# Patient Record
Sex: Male | Born: 2000 | Race: White | Hispanic: No | Marital: Single | Smoking: Current every day smoker
Health system: Southern US, Community
[De-identification: ages and names within clinical notes are randomized; demographics above are authoritative.]

## PROBLEM LIST (undated history)

## (undated) ENCOUNTER — Ambulatory Visit: Admission: EM | Disposition: A | Discharge status: Home / Self Care | Payer: Medicaid Other

## (undated) ENCOUNTER — Emergency Department: Payer: No Typology Code available for payment source | Source: Home / Self Care

## (undated) ENCOUNTER — Emergency Department

## (undated) DIAGNOSIS — F909 Attention-deficit hyperactivity disorder, unspecified type: Secondary | ICD-10-CM

## (undated) DIAGNOSIS — K5900 Constipation, unspecified: Secondary | ICD-10-CM

---
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
## 2021-02-02 DIAGNOSIS — G479 Sleep disorder, unspecified: Secondary | ICD-10-CM | POA: Diagnosis not present

## 2021-02-02 DIAGNOSIS — G4489 Other headache syndrome: Secondary | ICD-10-CM | POA: Diagnosis not present

## 2021-02-02 DIAGNOSIS — G44309 Post-traumatic headache, unspecified, not intractable: Secondary | ICD-10-CM | POA: Diagnosis not present

## 2021-02-02 DIAGNOSIS — R4586 Emotional lability: Secondary | ICD-10-CM | POA: Diagnosis not present

## 2021-02-08 DIAGNOSIS — K5900 Constipation, unspecified: Secondary | ICD-10-CM | POA: Diagnosis not present

## 2021-03-04 DIAGNOSIS — F913 Oppositional defiant disorder: Secondary | ICD-10-CM | POA: Diagnosis not present

## 2021-03-04 DIAGNOSIS — F902 Attention-deficit hyperactivity disorder, combined type: Secondary | ICD-10-CM | POA: Diagnosis not present

## 2021-03-19 DIAGNOSIS — F913 Oppositional defiant disorder: Secondary | ICD-10-CM | POA: Diagnosis not present

## 2021-03-19 DIAGNOSIS — F902 Attention-deficit hyperactivity disorder, combined type: Secondary | ICD-10-CM | POA: Diagnosis not present

## 2021-05-13 DIAGNOSIS — S92334A Nondisplaced fracture of third metatarsal bone, right foot, initial encounter for closed fracture: Secondary | ICD-10-CM | POA: Diagnosis not present

## 2021-05-13 DIAGNOSIS — S92514A Nondisplaced fracture of proximal phalanx of right lesser toe(s), initial encounter for closed fracture: Secondary | ICD-10-CM | POA: Diagnosis not present

## 2021-05-13 DIAGNOSIS — G8911 Acute pain due to trauma: Secondary | ICD-10-CM | POA: Diagnosis not present

## 2021-05-13 DIAGNOSIS — F489 Nonpsychotic mental disorder, unspecified: Secondary | ICD-10-CM | POA: Diagnosis not present

## 2021-05-28 DIAGNOSIS — F0781 Postconcussional syndrome: Secondary | ICD-10-CM | POA: Diagnosis not present

## 2021-05-28 DIAGNOSIS — I10 Essential (primary) hypertension: Secondary | ICD-10-CM | POA: Diagnosis not present

## 2021-05-28 DIAGNOSIS — R110 Nausea: Secondary | ICD-10-CM | POA: Diagnosis not present

## 2021-05-28 DIAGNOSIS — G4489 Other headache syndrome: Secondary | ICD-10-CM | POA: Diagnosis not present

## 2021-06-04 DIAGNOSIS — H5203 Hypermetropia, bilateral: Secondary | ICD-10-CM | POA: Diagnosis not present

## 2021-06-09 DIAGNOSIS — H5213 Myopia, bilateral: Secondary | ICD-10-CM | POA: Diagnosis not present

## 2021-06-29 DIAGNOSIS — G4489 Other headache syndrome: Secondary | ICD-10-CM | POA: Diagnosis not present

## 2021-06-29 DIAGNOSIS — H538 Other visual disturbances: Secondary | ICD-10-CM | POA: Diagnosis not present

## 2021-06-29 DIAGNOSIS — G478 Other sleep disorders: Secondary | ICD-10-CM | POA: Diagnosis not present

## 2021-06-29 DIAGNOSIS — R4586 Emotional lability: Secondary | ICD-10-CM | POA: Diagnosis not present

## 2021-07-01 DIAGNOSIS — J069 Acute upper respiratory infection, unspecified: Secondary | ICD-10-CM | POA: Diagnosis not present

## 2021-07-01 DIAGNOSIS — J0100 Acute maxillary sinusitis, unspecified: Secondary | ICD-10-CM | POA: Diagnosis not present

## 2021-07-01 DIAGNOSIS — Z03818 Encounter for observation for suspected exposure to other biological agents ruled out: Secondary | ICD-10-CM | POA: Diagnosis not present

## 2021-07-01 DIAGNOSIS — R110 Nausea: Secondary | ICD-10-CM | POA: Diagnosis not present

## 2021-07-08 DIAGNOSIS — Z20822 Contact with and (suspected) exposure to covid-19: Secondary | ICD-10-CM | POA: Diagnosis not present

## 2021-07-08 DIAGNOSIS — B349 Viral infection, unspecified: Secondary | ICD-10-CM | POA: Diagnosis not present

## 2021-07-08 DIAGNOSIS — R10819 Abdominal tenderness, unspecified site: Secondary | ICD-10-CM | POA: Diagnosis not present

## 2021-07-08 DIAGNOSIS — R509 Fever, unspecified: Secondary | ICD-10-CM | POA: Diagnosis not present

## 2021-07-08 DIAGNOSIS — F419 Anxiety disorder, unspecified: Secondary | ICD-10-CM | POA: Diagnosis not present

## 2021-07-08 DIAGNOSIS — R918 Other nonspecific abnormal finding of lung field: Secondary | ICD-10-CM | POA: Diagnosis not present

## 2021-07-08 DIAGNOSIS — F909 Attention-deficit hyperactivity disorder, unspecified type: Secondary | ICD-10-CM | POA: Diagnosis not present

## 2021-07-08 DIAGNOSIS — R350 Frequency of micturition: Secondary | ICD-10-CM | POA: Diagnosis not present

## 2021-07-08 DIAGNOSIS — R112 Nausea with vomiting, unspecified: Secondary | ICD-10-CM | POA: Diagnosis not present

## 2021-07-08 DIAGNOSIS — R5381 Other malaise: Secondary | ICD-10-CM | POA: Diagnosis not present

## 2021-07-14 DIAGNOSIS — H52223 Regular astigmatism, bilateral: Secondary | ICD-10-CM | POA: Diagnosis not present

## 2021-08-03 DIAGNOSIS — Z419 Encounter for procedure for purposes other than remedying health state, unspecified: Secondary | ICD-10-CM | POA: Diagnosis not present

## 2021-09-02 DIAGNOSIS — Z419 Encounter for procedure for purposes other than remedying health state, unspecified: Secondary | ICD-10-CM | POA: Diagnosis not present

## 2021-10-03 DIAGNOSIS — Z419 Encounter for procedure for purposes other than remedying health state, unspecified: Secondary | ICD-10-CM | POA: Diagnosis not present

## 2021-11-03 DIAGNOSIS — Z419 Encounter for procedure for purposes other than remedying health state, unspecified: Secondary | ICD-10-CM | POA: Diagnosis not present

## 2021-12-01 DIAGNOSIS — Z419 Encounter for procedure for purposes other than remedying health state, unspecified: Secondary | ICD-10-CM | POA: Diagnosis not present

## 2021-12-21 DIAGNOSIS — M5416 Radiculopathy, lumbar region: Secondary | ICD-10-CM | POA: Diagnosis not present

## 2021-12-28 DIAGNOSIS — M4807 Spinal stenosis, lumbosacral region: Secondary | ICD-10-CM | POA: Diagnosis not present

## 2021-12-28 DIAGNOSIS — M5126 Other intervertebral disc displacement, lumbar region: Secondary | ICD-10-CM | POA: Diagnosis not present

## 2021-12-28 DIAGNOSIS — M5127 Other intervertebral disc displacement, lumbosacral region: Secondary | ICD-10-CM | POA: Diagnosis not present

## 2021-12-28 DIAGNOSIS — M48061 Spinal stenosis, lumbar region without neurogenic claudication: Secondary | ICD-10-CM | POA: Diagnosis not present

## 2022-01-01 DIAGNOSIS — Z419 Encounter for procedure for purposes other than remedying health state, unspecified: Secondary | ICD-10-CM | POA: Diagnosis not present

## 2022-01-24 DIAGNOSIS — G250 Essential tremor: Secondary | ICD-10-CM | POA: Diagnosis not present

## 2022-01-28 DIAGNOSIS — M5416 Radiculopathy, lumbar region: Secondary | ICD-10-CM | POA: Diagnosis not present
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
## 2022-01-31 DIAGNOSIS — Z419 Encounter for procedure for purposes other than remedying health state, unspecified: Secondary | ICD-10-CM | POA: Diagnosis not present

## 2022-03-03 DIAGNOSIS — Z419 Encounter for procedure for purposes other than remedying health state, unspecified: Secondary | ICD-10-CM | POA: Diagnosis not present

## 2022-04-02 DIAGNOSIS — Z419 Encounter for procedure for purposes other than remedying health state, unspecified: Secondary | ICD-10-CM | POA: Diagnosis not present

## 2022-05-03 DIAGNOSIS — Z419 Encounter for procedure for purposes other than remedying health state, unspecified: Secondary | ICD-10-CM | POA: Diagnosis not present

## 2022-05-06 ENCOUNTER — Emergency Department: Payer: Medicaid Other

## 2022-05-06 ENCOUNTER — Other Ambulatory Visit: Payer: Self-pay

## 2022-05-06 ENCOUNTER — Emergency Department
Admission: EM | Admit: 2022-05-06 | Discharge: 2022-05-06 | Disposition: A | Discharge status: Home / Self Care | Payer: Medicaid Other | Attending: Emergency Medicine | Admitting: Emergency Medicine

## 2022-05-06 DIAGNOSIS — S060X9A Concussion with loss of consciousness of unspecified duration, initial encounter: Secondary | ICD-10-CM | POA: Insufficient documentation

## 2022-05-06 DIAGNOSIS — Y92810 Car as the place of occurrence of the external cause: Secondary | ICD-10-CM | POA: Insufficient documentation

## 2022-05-06 DIAGNOSIS — Y9389 Activity, other specified: Secondary | ICD-10-CM | POA: Diagnosis not present

## 2022-05-06 DIAGNOSIS — S069X9A Unspecified intracranial injury with loss of consciousness of unspecified duration, initial encounter: Secondary | ICD-10-CM | POA: Diagnosis not present

## 2022-05-06 DIAGNOSIS — S060X1A Concussion with loss of consciousness of 30 minutes or less, initial encounter: Secondary | ICD-10-CM | POA: Diagnosis not present

## 2022-05-06 DIAGNOSIS — W208XXA Other cause of strike by thrown, projected or falling object, initial encounter: Secondary | ICD-10-CM | POA: Insufficient documentation

## 2022-05-06 DIAGNOSIS — R9431 Abnormal electrocardiogram [ECG] [EKG]: Secondary | ICD-10-CM | POA: Diagnosis not present

## 2022-05-06 DIAGNOSIS — S0990XA Unspecified injury of head, initial encounter: Secondary | ICD-10-CM | POA: Diagnosis present

## 2022-05-06 DIAGNOSIS — S060X0A Concussion without loss of consciousness, initial encounter: Secondary | ICD-10-CM | POA: Diagnosis not present

## 2022-05-06 NOTE — ED Provider Notes (Signed)
[facility] Provider Note    Event Date/Time   First MD Initiated Contact with Patient 05/06/22 [phone]     (approximate)   History   Chief Complaint Head Injury and Loss of Consciousness   HPI Donald Arnold is a 21 y.o. male, history of ADHD, presents emergency department for evaluation of head injury.  Patient states he was working on his car yesterday when the hood fell down and hit him on the back of the head, causing him to lose consciousness.  Patient states that he remembers waking up on the ground and vomiting approximately 4-5 times after the event.  He is not nauseous at this time, though does state that he has photophobia and intermittently has episodes where his vision goes black occasionally, though is not having any visual disturbances now.  Denies hearing changes, chest pain, shortness of breath, neck pain, abdominal pain, numb/tingling in upper or lower extremities, or vertigo.  History Limitations: No limitations.        Physical Exam  Triage Vital Signs: ED Triage Vitals  Enc Vitals Group     BP 05/06/22 0938 118/66     Pulse Rate 05/06/22 0938 64     Resp 05/06/22 0938 14     Temp 05/06/22 0938 98.6 F (37 C)     Temp Source 05/06/22 0938 Oral     SpO2 05/06/22 0938 98 %     Weight 05/06/22 0934 186 lb (84.4 kg)     Height 05/06/22 0934 6\' 2"  (1.88 m)     Head Circumference --      Peak Flow --      Pain Score 05/06/22 0933 8     Pain Loc --      Pain Edu? --      Excl. in GC? --     Most recent vital signs: Vitals:   05/06/22 0938  BP: 118/66  Pulse: 64  Resp: 14  Temp: 98.6 F (37 C)  SpO2: 98%    General: Awake, NAD.  Skin: Warm, dry. No rashes or lesions.  Eyes: PERRL. Conjunctivae normal.  EOMI.  Gross visual acuity intact.  Visual fields intact. CV: Good peripheral perfusion.  Resp: Normal effort.  Abd: Soft, non-tender. No distention.  Neuro: At baseline. No gross neurological deficits.  Cranial nerves II  through XII intact.  5/5 strength in upper and lower extremities.  Sensation intact in all extremities.  He is able to ambulate on his own without ataxia  Focused Exam: No lacerations or gross deformities to the head.  No midline cervical spine tenderness.  Normal range of motion of the head/neck.  Physical Exam    ED Results / Procedures / Treatments  Labs (all labs ordered are listed, but only abnormal results are displayed) Labs Reviewed - No data to display   EKG N/A.   RADIOLOGY  ED Provider Interpretation: I personally viewed and interpreted the CT scan, no evidence of acute intracranial abnormalities.  CT Head Wo Contrast  Result Date: 05/06/2022 CLINICAL DATA:  Head trauma, moderate-severe. Patient was working on car yesterday and hood fell down and hit him on the head. Patient did lose consciousness and vomit afterwards. EXAM: CT HEAD WITHOUT CONTRAST TECHNIQUE: Contiguous axial images were obtained from the base of the skull through the vertex without intravenous contrast. RADIATION DOSE REDUCTION: This exam was performed according to the departmental dose-optimization program which includes automated exposure control, adjustment of the mA and/or kV according to patient  size and/or use of iterative reconstruction technique. COMPARISON:  August 08, 2020, July 01, 2020 FINDINGS: Brain: No evidence of acute infarction, hemorrhage, hydrocephalus, extra-axial collection or mass lesion/mass effect. Vascular: No hyperdense vessel or unexpected calcification. Skull: No acute fracture. Sinuses/Orbits: Partial opacification of the left maxillary sinus. Unchanged probable mucous retention cyst within the right sphenoid sinus. Other: No significant scalp swelling or hematoma. Unchanged tiny ovoid calcification in the left parietal scalp, likely a benign dermal inclusion cyst. IMPRESSION: No acute intracranial abnormality. Electronically Signed   By: Jacob Moores M.D.   On: 05/06/2022  10:35    PROCEDURES:  Critical Care performed: N/A.  Procedures    MEDICATIONS ORDERED IN ED: Medications - No data to display   IMPRESSION / MDM / ASSESSMENT AND PLAN / ED COURSE  I reviewed the triage vital signs and the nursing notes.                              Differential diagnosis includes, but is not limited to, concussion, epidural subdural hematoma, subarachnoid hemorrhage  Assessment/Plan Presentation consistent with concussion.  Head CT reassuring for no evidence of acute intracranial abnormalities.  He is not endorsing any cervical spine tenderness or decreased range of motion.  Neurological exam normal.  He has had concussions in the past and states that this feels like his previous ones.  Offered antiemetics, however he states that he is fine with over-the-counter medications that he has at home.  Encouraged him to follow-up with his primary care provider as needed.  Will discharge.  Provided the patient with anticipatory guidance, return precautions, and educational material. Encouraged the patient to return to the emergency department at any time if they begin to experience any new or worsening symptoms. Patient expressed understanding and agreed with the plan.   Patient's presentation is most consistent with acute complicated illness / injury requiring diagnostic workup.       FINAL CLINICAL IMPRESSION(S) / ED DIAGNOSES   Final diagnoses:  Concussion with loss of consciousness, initial encounter     Rx / DC Orders   ED Discharge Orders     None        Note:  This document was prepared using Dragon voice recognition software and may include unintentional dictation errors.   Varney Daily, [place] 05/06/22 1057    Concha Se, MD 05/06/22 [phone]

## 2022-05-06 NOTE — Discharge Instructions (Addendum)
-  Review the educational material provided.  You may take Tylenol/ibuprofen as needed for headache.  -Follow-up with your primary care provider as needed .  -Return to the emergency department anytime if you begin to experience any new or worsening symptoms.

## 2022-05-06 NOTE — ED Triage Notes (Signed)
Pt to ED POV for head injury and LOC that occurred yesterday around 1630-1700.  Pt was working on car yesterday and hood fell down hit him on head, pt did lose consciousness, pt remembers waking up on ground and walking inside, then vomiting 4-5 times after event. People around him had to shake him to wake him up.  Pt states that since event, "every 5 minutes or so my vision goes black. States that it resolves on its own. Endorses photophobia. Endorses frontal HA.  Pt walked to triage room with steady gait, is alert and oriented. States when gets up to walk feels dizzy at first, upon rising. Denies any other vision changes except as described above.

## 2022-05-06 NOTE — ED Notes (Signed)
See triage note  Presents s/p head injury  States his hood came down on his head  Pos LOC  also having some slight neck discomfort  Ambulates well to treatment room

## 2022-05-08 ENCOUNTER — Emergency Department: Payer: Medicaid Other

## 2022-05-08 ENCOUNTER — Other Ambulatory Visit: Payer: Self-pay

## 2022-05-08 DIAGNOSIS — R42 Dizziness and giddiness: Secondary | ICD-10-CM | POA: Diagnosis not present

## 2022-05-08 DIAGNOSIS — H9319 Tinnitus, unspecified ear: Secondary | ICD-10-CM | POA: Insufficient documentation

## 2022-05-08 DIAGNOSIS — F0781 Postconcussional syndrome: Secondary | ICD-10-CM | POA: Insufficient documentation

## 2022-05-08 DIAGNOSIS — R9431 Abnormal electrocardiogram [ECG] [EKG]: Secondary | ICD-10-CM | POA: Diagnosis not present

## 2022-05-08 DIAGNOSIS — H538 Other visual disturbances: Secondary | ICD-10-CM | POA: Diagnosis not present

## 2022-05-08 DIAGNOSIS — G44309 Post-traumatic headache, unspecified, not intractable: Secondary | ICD-10-CM | POA: Insufficient documentation

## 2022-05-08 DIAGNOSIS — S060XAD Concussion with loss of consciousness status unknown, subsequent encounter: Secondary | ICD-10-CM | POA: Diagnosis not present

## 2022-05-08 DIAGNOSIS — R55 Syncope and collapse: Secondary | ICD-10-CM | POA: Diagnosis not present

## 2022-05-08 DIAGNOSIS — R519 Headache, unspecified: Secondary | ICD-10-CM | POA: Diagnosis not present

## 2022-05-08 LAB — CBC
HCT: 48.2 % (ref 39.0–52.0)
Hemoglobin: 15.7 g/dL (ref 13.0–17.0)
MCH: 28.1 pg (ref 26.0–34.0)
MCHC: 32.6 g/dL (ref 30.0–36.0)
MCV: 86.4 fL (ref 80.0–100.0)
Platelets: 293 10*3/uL (ref 150–400)
RBC: 5.58 MIL/uL (ref 4.22–5.81)
RDW: 13.2 % (ref 11.5–15.5)
WBC: 8.5 10*3/uL (ref 4.0–10.5)
nRBC: 0 % (ref 0.0–0.2)

## 2022-05-08 LAB — BASIC METABOLIC PANEL
Anion gap: 9 (ref 5–15)
BUN: 16 mg/dL (ref 6–20)
CO2: 27 mmol/L (ref 22–32)
Calcium: 10.3 mg/dL (ref 8.9–10.3)
Chloride: 106 mmol/L (ref 98–111)
Creatinine, Ser: 1.12 mg/dL (ref 0.61–1.24)
GFR, Estimated: >60 mL/min (ref >60)
Glucose, Bld: 96 mg/dL (ref 70–99)
Potassium: 4.6 mmol/L (ref 3.5–5.1)
Sodium: 142 mmol/L (ref 135–145)
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
## 2022-05-09 ENCOUNTER — Emergency Department: Payer: Medicaid Other

## 2022-05-09 ENCOUNTER — Emergency Department
Admission: EM | Admit: 2022-05-09 | Discharge: 2022-05-09 | Disposition: A | Discharge status: Home / Self Care | Payer: Medicaid Other | Attending: Emergency Medicine | Admitting: Emergency Medicine

## 2022-05-09 ENCOUNTER — Encounter: Payer: Self-pay | Admitting: Radiology

## 2022-05-09 DIAGNOSIS — F0781 Postconcussional syndrome: Secondary | ICD-10-CM

## 2022-05-09 DIAGNOSIS — R42 Dizziness and giddiness: Secondary | ICD-10-CM | POA: Diagnosis not present

## 2022-05-09 NOTE — Discharge Instructions (Signed)
Drink plenty of fluids daily.  Return to the ER for worsening symptoms, persistent vomiting, lethargy or other concerns. 
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
## 2022-05-17 DIAGNOSIS — R569 Unspecified convulsions: Secondary | ICD-10-CM | POA: Diagnosis not present

## 2022-05-17 DIAGNOSIS — F0781 Postconcussional syndrome: Secondary | ICD-10-CM | POA: Diagnosis not present

## 2022-05-17 DIAGNOSIS — R110 Nausea: Secondary | ICD-10-CM | POA: Diagnosis not present

## 2022-05-17 DIAGNOSIS — R55 Syncope and collapse: Secondary | ICD-10-CM | POA: Diagnosis not present

## 2022-05-17 DIAGNOSIS — R4020 Unspecified coma: Secondary | ICD-10-CM | POA: Diagnosis not present

## 2022-05-17 DIAGNOSIS — G4489 Other headache syndrome: Secondary | ICD-10-CM | POA: Diagnosis not present

## 2022-06-03 DIAGNOSIS — Z419 Encounter for procedure for purposes other than remedying health state, unspecified: Secondary | ICD-10-CM | POA: Diagnosis not present

## 2022-07-03 DIAGNOSIS — Z419 Encounter for procedure for purposes other than remedying health state, unspecified: Secondary | ICD-10-CM | POA: Diagnosis not present

## 2022-07-08 DIAGNOSIS — S60221A Contusion of right hand, initial encounter: Secondary | ICD-10-CM | POA: Diagnosis not present

## 2022-07-19 ENCOUNTER — Emergency Department
Admission: EM | Admit: 2022-07-19 | Discharge: 2022-07-19 | Discharge status: Against Medical Advice | Payer: Medicaid Other

## 2022-07-19 DIAGNOSIS — Z743 Need for continuous supervision: Secondary | ICD-10-CM | POA: Diagnosis not present

## 2022-07-19 DIAGNOSIS — W19XXXA Unspecified fall, initial encounter: Secondary | ICD-10-CM | POA: Diagnosis not present

## 2022-07-19 DIAGNOSIS — R42 Dizziness and giddiness: Secondary | ICD-10-CM | POA: Diagnosis not present

## 2022-07-26 DIAGNOSIS — Z0001 Encounter for general adult medical examination with abnormal findings: Secondary | ICD-10-CM | POA: Diagnosis not present

## 2022-07-26 DIAGNOSIS — Z713 Dietary counseling and surveillance: Secondary | ICD-10-CM | POA: Diagnosis not present

## 2022-07-26 DIAGNOSIS — Z113 Encounter for screening for infections with a predominantly sexual mode of transmission: Secondary | ICD-10-CM | POA: Diagnosis not present

## 2022-07-26 DIAGNOSIS — Z68.41 Body mass index (BMI) pediatric, 5th percentile to less than 85th percentile for age: Secondary | ICD-10-CM | POA: Diagnosis not present

## 2022-07-26 DIAGNOSIS — K219 Gastro-esophageal reflux disease without esophagitis: Secondary | ICD-10-CM | POA: Diagnosis not present

## 2022-08-01 DIAGNOSIS — H539 Unspecified visual disturbance: Secondary | ICD-10-CM | POA: Diagnosis not present

## 2022-08-01 DIAGNOSIS — R4586 Emotional lability: Secondary | ICD-10-CM | POA: Diagnosis not present

## 2022-08-01 DIAGNOSIS — R569 Unspecified convulsions: Secondary | ICD-10-CM | POA: Diagnosis not present

## 2022-08-01 DIAGNOSIS — G479 Sleep disorder, unspecified: Secondary | ICD-10-CM | POA: Diagnosis not present

## 2022-08-01 DIAGNOSIS — I10 Essential (primary) hypertension: Secondary | ICD-10-CM | POA: Diagnosis not present

## 2022-08-01 DIAGNOSIS — R4020 Unspecified coma: Secondary | ICD-10-CM | POA: Diagnosis not present

## 2022-08-01 DIAGNOSIS — R55 Syncope and collapse: Secondary | ICD-10-CM | POA: Diagnosis not present

## 2022-08-01 DIAGNOSIS — R110 Nausea: Secondary | ICD-10-CM | POA: Diagnosis not present

## 2022-08-01 DIAGNOSIS — G4489 Other headache syndrome: Secondary | ICD-10-CM | POA: Diagnosis not present

## 2022-08-01 DIAGNOSIS — F0781 Postconcussional syndrome: Secondary | ICD-10-CM | POA: Diagnosis not present

## 2022-08-03 DIAGNOSIS — Z419 Encounter for procedure for purposes other than remedying health state, unspecified: Secondary | ICD-10-CM | POA: Diagnosis not present

## 2022-08-12 DIAGNOSIS — R569 Unspecified convulsions: Secondary | ICD-10-CM | POA: Diagnosis not present

## 2022-09-02 DIAGNOSIS — Z419 Encounter for procedure for purposes other than remedying health state, unspecified: Secondary | ICD-10-CM | POA: Diagnosis not present

## 2022-09-09 DIAGNOSIS — J019 Acute sinusitis, unspecified: Secondary | ICD-10-CM | POA: Diagnosis not present

## 2022-09-22 DIAGNOSIS — R2231 Localized swelling, mass and lump, right upper limb: Secondary | ICD-10-CM | POA: Diagnosis not present

## 2022-09-22 DIAGNOSIS — M79641 Pain in right hand: Secondary | ICD-10-CM | POA: Diagnosis not present

## 2022-09-22 DIAGNOSIS — S60221A Contusion of right hand, initial encounter: Secondary | ICD-10-CM | POA: Diagnosis not present

## 2022-09-29 DIAGNOSIS — S60221A Contusion of right hand, initial encounter: Secondary | ICD-10-CM | POA: Diagnosis not present

## 2022-09-29 DIAGNOSIS — M79641 Pain in right hand: Secondary | ICD-10-CM | POA: Diagnosis not present

## 2022-09-29 DIAGNOSIS — R2231 Localized swelling, mass and lump, right upper limb: Secondary | ICD-10-CM | POA: Diagnosis not present

## 2022-10-03 DIAGNOSIS — Z419 Encounter for procedure for purposes other than remedying health state, unspecified: Secondary | ICD-10-CM | POA: Diagnosis not present

## 2022-10-12 DIAGNOSIS — F0781 Postconcussional syndrome: Secondary | ICD-10-CM | POA: Diagnosis not present

## 2022-10-12 DIAGNOSIS — G479 Sleep disorder, unspecified: Secondary | ICD-10-CM | POA: Diagnosis not present

## 2022-10-12 DIAGNOSIS — I10 Essential (primary) hypertension: Secondary | ICD-10-CM | POA: Diagnosis not present

## 2022-10-12 DIAGNOSIS — R4586 Emotional lability: Secondary | ICD-10-CM | POA: Diagnosis not present

## 2022-10-12 DIAGNOSIS — R110 Nausea: Secondary | ICD-10-CM | POA: Diagnosis not present

## 2022-10-12 DIAGNOSIS — R569 Unspecified convulsions: Secondary | ICD-10-CM | POA: Diagnosis not present

## 2022-10-12 DIAGNOSIS — R519 Headache, unspecified: Secondary | ICD-10-CM | POA: Diagnosis not present

## 2022-11-01 DIAGNOSIS — M67441 Ganglion, right hand: Secondary | ICD-10-CM | POA: Diagnosis not present

## 2022-11-01 DIAGNOSIS — M79641 Pain in right hand: Secondary | ICD-10-CM | POA: Diagnosis not present

## 2022-11-01 DIAGNOSIS — R2231 Localized swelling, mass and lump, right upper limb: Secondary | ICD-10-CM | POA: Diagnosis not present

## 2022-11-03 DIAGNOSIS — Z419 Encounter for procedure for purposes other than remedying health state, unspecified: Secondary | ICD-10-CM | POA: Diagnosis not present

## 2022-11-08 DIAGNOSIS — R319 Hematuria, unspecified: Secondary | ICD-10-CM | POA: Diagnosis not present

## 2022-11-08 DIAGNOSIS — Z113 Encounter for screening for infections with a predominantly sexual mode of transmission: Secondary | ICD-10-CM | POA: Diagnosis not present

## 2022-11-08 DIAGNOSIS — N209 Urinary calculus, unspecified: Secondary | ICD-10-CM | POA: Diagnosis not present

## 2022-11-08 DIAGNOSIS — G5601 Carpal tunnel syndrome, right upper limb: Secondary | ICD-10-CM | POA: Diagnosis not present

## 2022-12-02 DIAGNOSIS — Z419 Encounter for procedure for purposes other than remedying health state, unspecified: Secondary | ICD-10-CM | POA: Diagnosis not present

## 2023-01-02 DIAGNOSIS — Z419 Encounter for procedure for purposes other than remedying health state, unspecified: Secondary | ICD-10-CM | POA: Diagnosis not present

## 2023-02-01 DIAGNOSIS — Z419 Encounter for procedure for purposes other than remedying health state, unspecified: Secondary | ICD-10-CM | POA: Diagnosis not present

## 2023-02-03 DIAGNOSIS — S60221A Contusion of right hand, initial encounter: Secondary | ICD-10-CM | POA: Diagnosis not present

## 2023-02-07 DIAGNOSIS — R2231 Localized swelling, mass and lump, right upper limb: Secondary | ICD-10-CM | POA: Diagnosis not present

## 2023-03-04 DIAGNOSIS — Z419 Encounter for procedure for purposes other than remedying health state, unspecified: Secondary | ICD-10-CM | POA: Diagnosis not present

## 2023-03-13 ENCOUNTER — Ambulatory Visit: Payer: Self-pay | Admitting: *Deleted

## 2023-03-13 NOTE — Telephone Encounter (Signed)
  Chief Complaint: abdominal pain/vomiting Symptoms: abdominal pain all over esp in am and causes vomiting white foamy emesis. Feels like "an ulcer". Feels like he is stressed out elevated BP 165/ 89 and rechecked for 134/?. Sudden onset x 1 month ago  vomiting upon awakening . Called out of work due to pain Frequency: 1 month  Pertinent Negatives: Patient denies fever, no radiating pain to chest or back . Disposition: [] ED /[x] Urgent Care (no appt availability in office) / [] Appointment(In office/virtual)/ []  [facility]/ [] Home Care/ [] Refused Recommended Disposition /[] [facility]/ []  Follow-up with PCP Additional Notes:   Awaiting new patient appt. None available until January at [facility]. Recommended UC/ ED. Offered UC VV and to set up My chart account. Patient reports he wants to go to UC.     Reason for Disposition  [1] MODERATE pain (e.g., interferes with normal activities) AND [2] pain comes and goes (cramps) AND [3] present > 24 hours  (Exception: Pain with Vomiting or Diarrhea - see that Guideline.)  Answer Assessment - Initial Assessment Questions 1. LOCATION: "Where does it hurt?"      All over abdominal  2. RADIATION: "Does the pain shoot anywhere else?" (e.g., chest, back)     No  3. ONSET: "When did the pain begin?" (Minutes, hours or days ago)      1 month  4. SUDDEN: "Gradual or sudden onset?"     Sudden  5. PATTERN "Does the pain come and go, or is it constant?"    - If it comes and goes: "How long does it last?" "Do you have pain now?"     (Note: Comes and goes means the pain is intermittent. It goes away completely between bouts.)    - If constant: "Is it getting better, staying the same, or getting worse?"      (Note: Constant means the pain never goes away completely; most serious pain is constant and gets worse.)      Comes and goes . Only noted vomiting when stressed out  6. SEVERITY: "How bad is the pain?"  (e.g., Scale 1-10; mild, moderate,  or severe)    - MILD (1-3): Doesn't interfere with normal activities, abdomen soft and not tender to touch.     - MODERATE (4-7): Interferes with normal activities or awakens from sleep, abdomen tender to touch. Vomiting white foamy emesis     - SEVERE (8-10): Excruciating pain, doubled over, unable to do any normal activities.       Interferes with work and sleep  7. RECURRENT SYMPTOM: "Have you ever had this type of stomach pain before?" If Yes, ask: "When was the last time?" and "What happened that time?"      Yes  8. CAUSE: "What do you think is causing the stomach pain?"     Not sure  9. RELIEVING/AGGRAVATING FACTORS: "What makes it better or worse?" (e.g., antacids, bending or twisting motion, bowel movement)     Sipping on water, nausea tablets not working . Not eating 10. OTHER SYMPTOMS: "Do you have any other symptoms?" (e.g., back pain, diarrhea, fever, urination pain, vomiting)       Vomiting when waking  Protocols used: Abdominal Pain - Male-A-AH

## 2023-03-21 ENCOUNTER — Other Ambulatory Visit: Payer: Self-pay

## 2023-03-21 ENCOUNTER — Emergency Department
Admission: EM | Admit: 2023-03-21 | Discharge: 2023-03-21 | Disposition: A | Discharge status: Home / Self Care | Payer: Medicaid Other | Attending: Emergency Medicine | Admitting: Emergency Medicine

## 2023-03-21 ENCOUNTER — Encounter: Payer: Self-pay | Admitting: Emergency Medicine

## 2023-03-21 DIAGNOSIS — R109 Unspecified abdominal pain: Secondary | ICD-10-CM | POA: Diagnosis not present

## 2023-03-21 DIAGNOSIS — R112 Nausea with vomiting, unspecified: Secondary | ICD-10-CM | POA: Diagnosis not present

## 2023-03-21 MED ORDER — LACTATED RINGERS IV BOLUS: 1000.0000 mL | Freq: Once | INTRAVENOUS | Status: DC

## 2023-03-21 MED ORDER — DROPERIDOL 2.5 MG/ML IJ SOLN: 1.2500 mg | Freq: Once | INTRAMUSCULAR | Status: DC

## 2023-03-21 NOTE — ED Provider Notes (Signed)
[facility] Provider Note    Event Date/Time   First MD Initiated Contact with Patient 03/21/23 [phone]     (approximate)   History   Abdominal Pain   HPI  Donald Arnold is a 22 y.o. male who presents to the ED for evaluation of Abdominal Pain   Patient presents to the ED for evaluation of emesis.  Patient reports that for the past few months now, every morning he wakes up with a lots of saliva in his mouth.  Reports he often feels nauseous and throws up intermittently throughout the day, and in the morning.  Reports this morning he woke up with the saliva as typical but then had 3-4 episodes of emesis, and by the last episode of emesis there was some pink streaking of blood within the emesis.  His fiance reports concerned about the blood and brings him to the ED to get checked out.  After he started throwing up, patient reports he developed some mild mid abdominal pain that lasted a few seconds-minutes and has since self resolved.  He reports feeling fine now and is only here to reassure his fiance.  He is initially agreeable to some basic labs, urinalysis but quickly changes his mind and request to leave without any diagnostics, acknowledging risks of undiagnosed pathology  Physical Exam   Triage Vital Signs: ED Triage Vitals [03/21/23 0601]  Enc Vitals Group     BP      Pulse      Resp      Temp      Temp src      SpO2      Weight 229 lb 4.5 oz (104 kg)     Height 6\' 3"  (1.905 m)     Head Circumference      Peak Flow      Pain Score 10     Pain Loc      Pain Edu?      Excl. in GC?     Most recent vital signs: Vitals:   03/21/23 0620  BP: 114/67  Pulse: (!) 53  Temp: 97.6 F (36.4 C)  SpO2: 98%    General: Awake, no distress.  CV:  Good peripheral perfusion.  Resp:  Normal effort.  Abd:  No distention.  Soft and benign throughout MSK:  No deformity noted.  Neuro:  No focal deficits appreciated. Other:     ED Results /  Procedures / Treatments   Labs (all labs ordered are listed, but only abnormal results are displayed) Labs Reviewed  CBC WITH DIFFERENTIAL/PLATELET  COMPREHENSIVE METABOLIC PANEL  LIPASE, BLOOD  URINALYSIS, ROUTINE W REFLEX MICROSCOPIC    EKG   RADIOLOGY   Official radiology report(s): No results found.  PROCEDURES and INTERVENTIONS:  Procedures  Medications  lactated ringers bolus 1,000 mL (has no administration in time range)  droperidol (INAPSINE) 2.5 MG/ML injection 1.25 mg (has no administration in time range)     IMPRESSION / MDM / ASSESSMENT AND PLAN / ED COURSE  I reviewed the triage vital signs and the nursing notes.  Differential diagnosis includes, but is not limited to, cyclic vomiting or cannabis hyperemesis, gastric ulcers, GERD, appendicitis  {Patient presents with symptoms of an acute illness or injury that is potentially life-threatening.  Patient presents after multiple episodes of emesis and refusing diagnostics.  He look systemically well with a reassuring exam, asymptomatic with a benign abdomen.  We discussed return precautions  Clinical Course as of 03/21/23  1610  Tue Mar 21, 2023  [id number] Patient now requesting to leave without diagnostics or intervention, reports he just wanted his fiance to hear from me that he was OK.  I again explained my preference for blood work and urinalysis at the very least, but he reports that he is fine he can always come back.  We discussed appropriate return precautions [DS]    Clinical Course User Index [DS] Delton Prairie, MD     FINAL CLINICAL IMPRESSION(S) / ED DIAGNOSES   Final diagnoses:  Nausea and vomiting, unspecified vomiting type     Rx / DC Orders   ED Discharge Orders     None        Note:  This document was prepared using Dragon voice recognition software and may include unintentional dictation errors.   Delton Prairie, MD 03/21/23 [phone]

## 2023-03-21 NOTE — ED Triage Notes (Signed)
Patient ambulatory to triage with steady gait, without difficulty or distress noted; pt reports mid abd pain this morning; V x 3 and noted some blood in emesis

## 2023-04-03 DIAGNOSIS — Z419 Encounter for procedure for purposes other than remedying health state, unspecified: Secondary | ICD-10-CM | POA: Diagnosis not present

## 2023-04-12 ENCOUNTER — Emergency Department: Payer: Medicaid Other

## 2023-04-12 ENCOUNTER — Emergency Department
Admission: EM | Admit: 2023-04-12 | Discharge: 2023-04-12 | Disposition: A | Discharge status: Home / Self Care | Payer: Medicaid Other | Attending: Emergency Medicine | Admitting: Emergency Medicine

## 2023-04-12 DIAGNOSIS — N390 Urinary tract infection, site not specified: Secondary | ICD-10-CM | POA: Insufficient documentation

## 2023-04-12 DIAGNOSIS — R109 Unspecified abdominal pain: Secondary | ICD-10-CM | POA: Diagnosis not present

## 2023-04-12 DIAGNOSIS — I10 Essential (primary) hypertension: Secondary | ICD-10-CM | POA: Diagnosis not present

## 2023-04-12 DIAGNOSIS — R1030 Lower abdominal pain, unspecified: Secondary | ICD-10-CM | POA: Diagnosis not present

## 2023-04-12 DIAGNOSIS — R319 Hematuria, unspecified: Secondary | ICD-10-CM | POA: Diagnosis not present

## 2023-04-12 DIAGNOSIS — Z743 Need for continuous supervision: Secondary | ICD-10-CM | POA: Diagnosis not present

## 2023-04-12 DIAGNOSIS — N200 Calculus of kidney: Secondary | ICD-10-CM | POA: Diagnosis not present

## 2023-04-12 LAB — CBC WITH DIFFERENTIAL/PLATELET
Abs Immature Granulocytes: 0.02 10*3/uL (ref 0.00–0.07)
Basophils Absolute: 0 10*3/uL (ref 0.0–0.1)
Basophils Relative: 0 %
Eosinophils Absolute: 0.1 10*3/uL (ref 0.0–0.5)
Eosinophils Relative: 1 %
HCT: 48.1 % (ref 39.0–52.0)
Hemoglobin: 16.3 g/dL (ref 13.0–17.0)
Immature Granulocytes: 0 %
Lymphocytes Relative: 21 %
Lymphs Abs: 1.5 10*3/uL (ref 0.7–4.0)
MCH: 28.4 pg (ref 26.0–34.0)
MCHC: 33.9 g/dL (ref 30.0–36.0)
MCV: 83.9 fL (ref 80.0–100.0)
Monocytes Absolute: 0.4 10*3/uL (ref 0.1–1.0)
Monocytes Relative: 5 %
Neutro Abs: 5.1 10*3/uL (ref 1.7–7.7)
Neutrophils Relative %: 73 %
Platelets: 278 10*3/uL (ref 150–400)
RBC: 5.73 MIL/uL (ref 4.22–5.81)
RDW: 13 % (ref 11.5–15.5)
WBC: 7.1 10*3/uL (ref 4.0–10.5)
nRBC: 0 % (ref 0.0–0.2)

## 2023-04-12 LAB — COMPREHENSIVE METABOLIC PANEL
ALT: 18 U/L (ref 0–44)
AST: 17 U/L (ref 15–41)
Albumin: 5.4 g/dL — ABNORMAL HIGH (ref 3.5–5.0)
Alkaline Phosphatase: 60 U/L (ref 38–126)
Anion gap: 11 (ref 5–15)
BUN: 14 mg/dL (ref 6–20)
CO2: 21 mmol/L — ABNORMAL LOW (ref 22–32)
Calcium: 10 mg/dL (ref 8.9–10.3)
Chloride: 105 mmol/L (ref 98–111)
Creatinine, Ser: 1.06 mg/dL (ref 0.61–1.24)
GFR, Estimated: >60 mL/min (ref >60)
Glucose, Bld: 125 mg/dL — ABNORMAL HIGH (ref 70–99)
Potassium: 3.9 mmol/L (ref 3.5–5.1)
Sodium: 137 mmol/L (ref 135–145)
Total Bilirubin: 1.3 mg/dL — ABNORMAL HIGH (ref 0.3–1.2)
Total Protein: 8 g/dL (ref 6.5–8.1)

## 2023-04-12 LAB — CHLAMYDIA/NGC RT PCR (ARMC ONLY)
Chlamydia Tr: NOT DETECTED
N gonorrhoeae: NOT DETECTED

## 2023-04-12 LAB — URINALYSIS, ROUTINE W REFLEX MICROSCOPIC
Bacteria, UA: NONE SEEN
Bilirubin Urine: NEGATIVE
Glucose, UA: NEGATIVE mg/dL
Hgb urine dipstick: NEGATIVE
Ketones, ur: 20 mg/dL — AB
Nitrite: NEGATIVE
Protein, ur: 100 mg/dL — AB
Specific Gravity, Urine: 1.032 — ABNORMAL HIGH (ref 1.005–1.030)
pH: 5 (ref 5.0–8.0)

## 2023-04-12 LAB — LIPASE, BLOOD: Lipase: 32 U/L (ref 11–51)

## 2023-04-12 MED ORDER — CEPHALEXIN 500 MG PO CAPS: 500.0000 mg | ORAL_CAPSULE | Freq: Four times a day (QID) | ORAL | 0 refills | Status: AC

## 2023-04-12 MED ORDER — LACTATED RINGERS IV BOLUS
1000.0000 mL | Freq: Once | INTRAVENOUS | Status: AC
  Administered 2023-04-12: 1000 mL via INTRAVENOUS

## 2023-04-12 NOTE — ED Notes (Signed)
ED Provider at bedside. 

## 2023-04-12 NOTE — ED Provider Notes (Signed)
[facility] Provider Note    Event Date/Time   First MD Initiated Contact with Patient 04/12/23 1502     (approximate)   History   Chief Complaint Abdominal Pain   HPI  Donald Arnold is a 22 y.o. male with no significant past medical history who presents to the ED complain of abdominal pain.  Patient reports that he has been having intermittent episodes of severe pain in the center of his lower abdomen for the past couple of weeks.  He describes pain feeling like a spasm associated with occasional blood in his urine.  He additionally reports some burning when he goes to urinate, but denies any discharge or pain in his testicles.  He has not had any fever, flank pain, nausea, vomiting, or changes in his bowel movements.  He is sexually active, but states he uses protection and only has 1 partner.     Physical Exam   Triage Vital Signs: ED Triage Vitals  Enc Vitals Group     BP      Pulse      Resp      Temp      Temp src      SpO2      Weight      Height      Head Circumference      Peak Flow      Pain Score      Pain Loc      Pain Edu?      Excl. in GC?     Most recent vital signs: Vitals:   04/12/23 1530 04/12/23 1618  BP: 127/69 (!) 142/91  Pulse: (!) 120 94  Resp:    Temp:    SpO2: 98% 100%    Constitutional: Alert and oriented. Eyes: Conjunctivae are normal. Head: Atraumatic. Nose: No congestion/rhinnorhea. Mouth/Throat: Mucous membranes are moist.  Cardiovascular: Normal rate, regular rhythm. Grossly normal heart sounds.  2+ radial pulses bilaterally. Respiratory: Normal respiratory effort.  No retractions. Lungs CTAB. Gastrointestinal: Soft and nontender. No distention.  No CVA tenderness bilaterally. Musculoskeletal: No lower extremity tenderness nor edema.  Neurologic:  Normal speech and language. No gross focal neurologic deficits are appreciated.    ED Results / Procedures / Treatments   Labs (all labs ordered  are listed, but only abnormal results are displayed) Labs Reviewed  COMPREHENSIVE METABOLIC PANEL - Abnormal; Notable for the following components:      Result Value   CO2 21 (*)    Glucose, Bld 125 (*)    Albumin 5.4 (*)    Total Bilirubin 1.3 (*)    All other components within normal limits  URINALYSIS, ROUTINE W REFLEX MICROSCOPIC - Abnormal; Notable for the following components:   Color, Urine AMBER (*)    APPearance HAZY (*)    Specific Gravity, Urine 1.032 (*)    Ketones, ur 20 (*)    Protein, ur 100 (*)    Leukocytes,Ua SMALL (*)    All other components within normal limits  CHLAMYDIA/NGC RT PCR ([facility] ONLY)            URINE CULTURE  CBC WITH DIFFERENTIAL/PLATELET  LIPASE, BLOOD    RADIOLOGY CT renal protocol reviewed and interpreted by me with no obstructing stones or inflammatory changes.  PROCEDURES:  Critical Care performed: No  Procedures   MEDICATIONS ORDERED IN ED: Medications  lactated ringers bolus 1,000 mL (0 mLs Intravenous Stopped 04/12/23 1625)     IMPRESSION / MDM /  ASSESSMENT AND PLAN / ED COURSE  I reviewed the triage vital signs and the nursing notes.                              22 y.o. male with no significant past medical history presents to the ED with intermittent spastic pain in his lower abdomen associated with hematuria and dysuria.  Patient's presentation is most consistent with acute presentation with potential threat to life or bodily function.  Differential diagnosis includes, but is not limited to, cystitis, urethritis, pyelonephritis, kidney stone.  Patient well-appearing and in no acute distress, vital signs are unremarkable.  His abdominal exam is benign and he has no CVA tenderness.  Labs are reassuring with no significant anemia, leukocytosis, tract abnormality, or AKI.  LFTs and lipase are unremarkable.  Urinalysis shows evidence of UTI, urine also sent for GC/chlamydia but patient wishes to hold off on empiric treatment for  STIs at this time.  CT imaging negative for kidney stone or other acute process.  Patient appropriate for outpatient management with antibiotics, he was provided referral to urology for follow-up.  He was counseled to return to the ED for new or worsening symptoms, patient agrees with plan.      FINAL CLINICAL IMPRESSION(S) / ED DIAGNOSES   Final diagnoses:  Lower abdominal pain  Lower urinary tract infectious disease     Rx / DC Orders   ED Discharge Orders          Ordered    cephALEXin (KEFLEX) 500 MG capsule  4 times daily        04/12/23 1704             Note:  This document was prepared using Dragon voice recognition software and may include unintentional dictation errors.   Chesley Noon, MD 04/12/23 [phone]
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
## 2023-04-13 LAB — URINE CULTURE: Culture: <10000 — AB

## 2023-05-04 DIAGNOSIS — Z419 Encounter for procedure for purposes other than remedying health state, unspecified: Secondary | ICD-10-CM | POA: Diagnosis not present

## 2023-05-05 ENCOUNTER — Ambulatory Visit: Payer: Medicaid Other | Admitting: Urology

## 2023-05-25 ENCOUNTER — Ambulatory Visit: Payer: Medicaid Other | Admitting: Nurse Practitioner

## 2023-06-04 DIAGNOSIS — Z419 Encounter for procedure for purposes other than remedying health state, unspecified: Secondary | ICD-10-CM | POA: Diagnosis not present

## 2023-07-04 DIAGNOSIS — Z419 Encounter for procedure for purposes other than remedying health state, unspecified: Secondary | ICD-10-CM | POA: Diagnosis not present

## 2023-08-04 DIAGNOSIS — Z419 Encounter for procedure for purposes other than remedying health state, unspecified: Secondary | ICD-10-CM | POA: Diagnosis not present

## 2023-09-03 DIAGNOSIS — Z419 Encounter for procedure for purposes other than remedying health state, unspecified: Secondary | ICD-10-CM | POA: Diagnosis not present

## 2023-10-04 DIAGNOSIS — Z419 Encounter for procedure for purposes other than remedying health state, unspecified: Secondary | ICD-10-CM | POA: Diagnosis not present

## 2023-10-18 DIAGNOSIS — I10 Essential (primary) hypertension: Secondary | ICD-10-CM | POA: Diagnosis not present

## 2023-10-18 DIAGNOSIS — J4521 Mild intermittent asthma with (acute) exacerbation: Secondary | ICD-10-CM | POA: Diagnosis not present

## 2023-10-18 DIAGNOSIS — R0602 Shortness of breath: Secondary | ICD-10-CM | POA: Diagnosis not present

## 2023-10-25 DIAGNOSIS — J4520 Mild intermittent asthma, uncomplicated: Secondary | ICD-10-CM | POA: Diagnosis not present

## 2023-11-04 DIAGNOSIS — Z419 Encounter for procedure for purposes other than remedying health state, unspecified: Secondary | ICD-10-CM | POA: Diagnosis not present

## 2023-12-02 DIAGNOSIS — Z419 Encounter for procedure for purposes other than remedying health state, unspecified: Secondary | ICD-10-CM | POA: Diagnosis not present

## 2024-01-12 DIAGNOSIS — S161XXA Strain of muscle, fascia and tendon at neck level, initial encounter: Secondary | ICD-10-CM | POA: Diagnosis not present

## 2024-01-12 DIAGNOSIS — S0990XA Unspecified injury of head, initial encounter: Secondary | ICD-10-CM | POA: Diagnosis not present

## 2024-01-12 DIAGNOSIS — Y92410 Unspecified street and highway as the place of occurrence of the external cause: Secondary | ICD-10-CM | POA: Diagnosis not present

## 2024-01-12 DIAGNOSIS — G44319 Acute post-traumatic headache, not intractable: Secondary | ICD-10-CM | POA: Diagnosis not present

## 2024-01-12 DIAGNOSIS — R030 Elevated blood-pressure reading, without diagnosis of hypertension: Secondary | ICD-10-CM | POA: Diagnosis not present

## 2024-01-12 DIAGNOSIS — F17210 Nicotine dependence, cigarettes, uncomplicated: Secondary | ICD-10-CM | POA: Diagnosis not present

## 2024-01-12 DIAGNOSIS — M542 Cervicalgia: Secondary | ICD-10-CM | POA: Diagnosis not present

## 2024-02-12 DIAGNOSIS — Z419 Encounter for procedure for purposes other than remedying health state, unspecified: Secondary | ICD-10-CM | POA: Diagnosis not present

## 2024-03-24 DIAGNOSIS — F909 Attention-deficit hyperactivity disorder, unspecified type: Secondary | ICD-10-CM | POA: Diagnosis not present

## 2024-03-24 DIAGNOSIS — F17210 Nicotine dependence, cigarettes, uncomplicated: Secondary | ICD-10-CM | POA: Diagnosis not present

## 2024-03-24 DIAGNOSIS — F419 Anxiety disorder, unspecified: Secondary | ICD-10-CM | POA: Diagnosis not present

## 2024-03-24 DIAGNOSIS — R0600 Dyspnea, unspecified: Secondary | ICD-10-CM | POA: Diagnosis not present

## 2024-03-24 DIAGNOSIS — J9801 Acute bronchospasm: Secondary | ICD-10-CM | POA: Diagnosis not present

## 2024-03-24 DIAGNOSIS — Z79899 Other long term (current) drug therapy: Secondary | ICD-10-CM | POA: Diagnosis not present

## 2024-03-24 DIAGNOSIS — Z1152 Encounter for screening for COVID-19: Secondary | ICD-10-CM | POA: Diagnosis not present

## 2024-06-14 DIAGNOSIS — Z419 Encounter for procedure for purposes other than remedying health state, unspecified: Secondary | ICD-10-CM | POA: Diagnosis not present

## 2024-07-14 DIAGNOSIS — Z419 Encounter for procedure for purposes other than remedying health state, unspecified: Secondary | ICD-10-CM | POA: Diagnosis not present

## 2024-07-29 ENCOUNTER — Emergency Department

## 2024-07-29 ENCOUNTER — Other Ambulatory Visit: Payer: Self-pay

## 2024-07-29 ENCOUNTER — Ambulatory Visit

## 2024-07-29 ENCOUNTER — Emergency Department
Admission: EM | Admit: 2024-07-29 | Discharge: 2024-07-29 | Disposition: A | Discharge status: Home / Self Care | Attending: Emergency Medicine | Admitting: Emergency Medicine

## 2024-07-29 ENCOUNTER — Emergency Department: Admission: RE | Admit: 2024-07-29 | Source: Ambulatory Visit

## 2024-07-29 DIAGNOSIS — S0990XA Unspecified injury of head, initial encounter: Secondary | ICD-10-CM

## 2024-07-29 DIAGNOSIS — Z5329 Procedure and treatment not carried out because of patient's decision for other reasons: Secondary | ICD-10-CM | POA: Diagnosis not present

## 2024-07-29 DIAGNOSIS — J3489 Other specified disorders of nose and nasal sinuses: Secondary | ICD-10-CM | POA: Diagnosis not present

## 2024-07-29 DIAGNOSIS — R1111 Vomiting without nausea: Secondary | ICD-10-CM | POA: Diagnosis not present

## 2024-07-29 DIAGNOSIS — W01190A Fall on same level from slipping, tripping and stumbling with subsequent striking against furniture, initial encounter: Secondary | ICD-10-CM | POA: Insufficient documentation

## 2024-07-29 DIAGNOSIS — R079 Chest pain, unspecified: Secondary | ICD-10-CM | POA: Diagnosis not present

## 2024-07-29 DIAGNOSIS — G4489 Other headache syndrome: Secondary | ICD-10-CM | POA: Diagnosis not present

## 2024-07-29 DIAGNOSIS — R404 Transient alteration of awareness: Secondary | ICD-10-CM | POA: Diagnosis not present

## 2024-07-29 DIAGNOSIS — R001 Bradycardia, unspecified: Secondary | ICD-10-CM | POA: Diagnosis not present

## 2024-07-29 DIAGNOSIS — W19XXXA Unspecified fall, initial encounter: Secondary | ICD-10-CM | POA: Diagnosis not present

## 2024-07-29 DIAGNOSIS — R4182 Altered mental status, unspecified: Secondary | ICD-10-CM | POA: Diagnosis present

## 2024-07-29 HISTORY — DX: Attention-deficit hyperactivity disorder, unspecified type: F90.9

## 2024-07-29 LAB — CBC
HCT: 46.9 % (ref 39.0–52.0)
Hemoglobin: 16 g/dL (ref 13.0–17.0)
MCH: 28.9 pg (ref 26.0–34.0)
MCHC: 34.1 g/dL (ref 30.0–36.0)
MCV: 84.7 fL (ref 80.0–100.0)
Platelets: 250 K/uL (ref 150–400)
RBC: 5.54 MIL/uL (ref 4.22–5.81)
RDW: 12.6 % (ref 11.5–15.5)
WBC: 5 K/uL (ref 4.0–10.5)
nRBC: 0 % (ref 0.0–0.2)

## 2024-07-29 LAB — COMPREHENSIVE METABOLIC PANEL WITH GFR
ALT: 16 U/L (ref 0–44)
AST: 19 U/L (ref 15–41)
Albumin: 4.7 g/dL (ref 3.5–5.0)
Alkaline Phosphatase: 48 U/L (ref 38–126)
Anion gap: 10 (ref 5–15)
BUN: 16 mg/dL (ref 6–20)
CO2: 21 mmol/L — ABNORMAL LOW (ref 22–32)
Calcium: 9.5 mg/dL (ref 8.9–10.3)
Chloride: 108 mmol/L (ref 98–111)
Creatinine, Ser: 1.05 mg/dL (ref 0.61–1.24)
GFR, Estimated: >60 mL/min (ref >60)
Glucose, Bld: 104 mg/dL — ABNORMAL HIGH (ref 70–99)
Potassium: 4.3 mmol/L (ref 3.5–5.1)
Sodium: 139 mmol/L (ref 135–145)
Total Bilirubin: 1.3 mg/dL — ABNORMAL HIGH (ref 0.0–1.2)
Total Protein: 7.5 g/dL (ref 6.5–8.1)

## 2024-07-29 LAB — ETHANOL: Alcohol, Ethyl (B): <15 mg/dL (ref <15)

## 2024-07-29 MED ORDER — DIPHENHYDRAMINE HCL 50 MG/ML IJ SOLN
25.0000 mg | Freq: Once | INTRAMUSCULAR | Status: AC
  Administered 2024-07-29 (×2): 25 mg via INTRAVENOUS
  Filled 2024-07-29: qty 1

## 2024-07-29 MED ORDER — LACTATED RINGERS IV BOLUS
1000.0000 mL | Freq: Once | INTRAVENOUS | Status: AC
  Administered 2024-07-29 (×2): 1000 mL via INTRAVENOUS

## 2024-07-29 MED ORDER — PROCHLORPERAZINE EDISYLATE 10 MG/2ML IJ SOLN
10.0000 mg | Freq: Once | INTRAMUSCULAR | Status: AC
  Administered 2024-07-29 (×2): 10 mg via INTRAVENOUS
  Filled 2024-07-29: qty 2

## 2024-07-29 NOTE — ED Triage Notes (Signed)
 Pt comes via EMS from home with AMS. Pt staes yesterday he fell threw window and hit his head. Pt has had trouble with memory since then.

## 2024-07-29 NOTE — ED Notes (Signed)
 Pt stating he needs to leave immediately, informed he cannot leave and is unable to make his own decisions considering AMS and unable to state name. Pt suddenly oriented x4. Dr Willo to bedside. Pt to leave AMA. NAD noted

## 2024-07-29 NOTE — ED Notes (Signed)
 Family to this nurse desk stating pt keeps passing out. Pt brought to desk and vitals checked. Pt able to answer some questions. Pt not opening his eyes but grimace with sternal rub. Vitals all WNL. Charge RN called and pt taken to [address]

## 2024-07-29 NOTE — ED Provider Notes (Signed)
 [facility] Provider Note    Event Date/Time   First MD Initiated Contact with Patient 07/29/24 1537     (approximate)   History   Chief Complaint Altered Mental Status   HPI  Donald Arnold is a 23 y.o. male with past medical history of ADHD who presents to the ED for altered mental status.  Per fianc at bedside, patient was attempting to get into his home through the window last night, was crawling through the window and had his hands on the top of a dresser, that gave out and caused him to fall to the ground.  He reportedly hit the bridge of his nose on the corner of the dresser.  He did not initially lose consciousness according to the fianc, but did complain of a headache immediately afterwards and then had a syncopal episode a few minutes later.  She reports that he has spent much of the time in bed since then, has complained of headache and nausea with a few episodes of vomiting.  Family also states that he has been confused, unable to recall the events of last night and not being able to answer basic questions today.  Patient reports headache, neck pain, nausea, photophobia, and confusion.  He denies any alcohol or drug use.     Physical Exam   Triage Vital Signs: ED Triage Vitals  Encounter Vitals Group     BP 07/29/24 1511 (!) 134/93     Girls Systolic BP Percentile --      Girls Diastolic BP Percentile --      Boys Systolic BP Percentile --      Boys Diastolic BP Percentile --      Pulse Rate 07/29/24 1511 78     Resp 07/29/24 1511 18     Temp 07/29/24 1511 98 F (36.7 C)     Temp Source 07/29/24 1511 Oral     SpO2 07/29/24 1511 96 %     Weight 07/29/24 1518 240 lb (108.9 kg)     Height 07/29/24 1518 6' 4 (1.93 m)     Head Circumference --      Peak Flow --      Pain Score 07/29/24 1512 8     Pain Loc --      Pain Education --      Exclude from Growth Chart --     Most recent vital signs: Vitals:   07/29/24 1511 07/29/24 1537   BP: (!) 134/93 128/85  Pulse: 78 72  Resp: 18 18  Temp: 98 F (36.7 C)   SpO2: 96% 100%    Constitutional: Awake and alert, oriented to person and place, but not time or situation. Eyes: Conjunctivae are normal.  Pupils equal, round, and reactive to light bilaterally. Head: Tenderness to palpation noted over the bridge of the nose with no ecchymosis or edema. Nose: No congestion/rhinnorhea. Mouth/Throat: Mucous membranes are moist.  Neck: Midline cervical spine tenderness to palpation. Cardiovascular: Normal rate, regular rhythm. Grossly normal heart sounds.  2+ radial pulses bilaterally. Respiratory: Normal respiratory effort.  No retractions. Lungs CTAB.  Central chest wall tenderness to palpation. Gastrointestinal: Soft and nontender. No distention. Musculoskeletal: No lower extremity tenderness nor edema.  No upper extremity bony tenderness to palpation. Neurologic:  Normal speech and language. No gross focal neurologic deficits are appreciated.    ED Results / Procedures / Treatments   Labs (all labs ordered are listed, but only abnormal results are displayed) Labs Reviewed  COMPREHENSIVE  METABOLIC PANEL WITH GFR - Abnormal; Notable for the following components:      Result Value   CO2 21 (*)    Glucose, Bld 104 (*)    Total Bilirubin 1.3 (*)    All other components within normal limits  CBC  URINALYSIS, ROUTINE W REFLEX MICROSCOPIC  ETHANOL  URINE DRUG SCREEN, QUALITATIVE ([facility] ONLY)     EKG  ED ECG REPORT I, Carlin Palin, the attending physician, personally viewed and interpreted this ECG.   Date: 07/29/2024  EKG Time: 15:55  Rate: 58  Rhythm: sinus bradycardia  Axis: Normal  Intervals:none  ST&T Change: None  PROCEDURES:  Critical Care performed: No  Procedures   MEDICATIONS ORDERED IN ED: Medications  prochlorperazine (COMPAZINE) injection 10 mg (10 mg Intravenous Given 07/29/24 1600)  diphenhydrAMINE (BENADRYL) injection 25 mg (25 mg  Intravenous Given 07/29/24 1600)  lactated ringers bolus 1,000 mL (1,000 mLs Intravenous New Bag/Given 07/29/24 1558)     IMPRESSION / MDM / ASSESSMENT AND PLAN / ED COURSE  I reviewed the triage vital signs and the nursing notes.                              23 y.o. male with past medical history of ADHD who presents to the ED for headache, neck pain, nausea, vomiting, and syncopal episode after falling and striking his head last night.  Patient's presentation is most consistent with acute presentation with potential threat to life or bodily function.  Differential diagnosis includes, but is not limited to, intracranial injury, cervical spine injury, facial fracture, concussion, arrhythmia, anemia, electrolyte abnormality, AKI, substance abuse.  Patient well-appearing and in no acute distress, vital signs are unremarkable.  EKG shows no evidence of arrhythmia or ischemia and I doubt cardiac etiology for syncope.  We will check CT head, maxillofacial, and cervical spine as well as chest x-ray for evidence of traumatic injury. Patient initially stated that he could not recall his name or where he was at, was eventually able to do so with encouragement.  Exam limited secondary to effort, but patient with a nonfocal neurologic exam.  Symptoms sound most likely to be related to postconcussive syndrome, will hydrate with IV fluids and treat symptomatically with IV Compazine and Benadryl.    Patient now stating that he would like to leave prior to CT imaging.  He is alert and oriented at this time, answering all orientations correctly.  He expresses understanding of the risks of leaving without imaging, up to them and including death.  He has capacity to make this decision based on my assessment, will be leaving AGAINST MEDICAL ADVICE.  He was counseled to return to the ED for further evaluation at any time.      FINAL CLINICAL IMPRESSION(S) / ED DIAGNOSES   Final diagnoses:  Injury of head,  initial encounter     Rx / DC Orders   ED Discharge Orders     None        Note:  This document was prepared using Dragon voice recognition software and may include unintentional dictation errors.   Palin Carlin, MD 07/29/24 [phone]

## 2024-07-29 NOTE — ED Notes (Signed)
Red tube sent to lab.  
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
## 2024-07-29 NOTE — ED Notes (Signed)
 See triage note, family reports pt has had AMS after a fall yesterday. Pt in NAD. When asked name, pt states I dont know but my initials are ND. Pt closes eyes during conversation and will not answer questions but responsive to painful stimuli.

## 2024-07-30 ENCOUNTER — Encounter: Payer: Self-pay | Admitting: Emergency Medicine

## 2024-09-13 DIAGNOSIS — S0990XA Unspecified injury of head, initial encounter: Secondary | ICD-10-CM | POA: Diagnosis not present

## 2024-09-13 DIAGNOSIS — H547 Unspecified visual loss: Secondary | ICD-10-CM | POA: Diagnosis not present

## 2024-09-13 DIAGNOSIS — W228XXA Striking against or struck by other objects, initial encounter: Secondary | ICD-10-CM | POA: Diagnosis not present

## 2024-09-20 DIAGNOSIS — S0011XA Contusion of right eyelid and periocular area, initial encounter: Secondary | ICD-10-CM | POA: Diagnosis not present

## 2024-09-20 DIAGNOSIS — H1131 Conjunctival hemorrhage, right eye: Secondary | ICD-10-CM | POA: Diagnosis not present

## 2024-09-20 DIAGNOSIS — H538 Other visual disturbances: Secondary | ICD-10-CM | POA: Diagnosis not present

## 2024-09-20 DIAGNOSIS — F17210 Nicotine dependence, cigarettes, uncomplicated: Secondary | ICD-10-CM | POA: Diagnosis not present

## 2024-09-20 DIAGNOSIS — Z5320 Procedure and treatment not carried out because of patient's decision for unspecified reasons: Secondary | ICD-10-CM | POA: Diagnosis not present

## 2024-09-20 DIAGNOSIS — H5461 Unqualified visual loss, right eye, normal vision left eye: Secondary | ICD-10-CM | POA: Diagnosis not present
